# Patient Record
Sex: Female | Born: 1985 | Race: White | Hispanic: No | Marital: Single | Smoking: Current every day smoker
Health system: Southern US, Community
[De-identification: ages and names within clinical notes are randomized; demographics above are authoritative.]

---

## 2004-05-31 ENCOUNTER — Inpatient Hospital Stay: Payer: Self-pay | Admitting: Obstetrics & Gynecology

## 2005-03-11 ENCOUNTER — Emergency Department: Payer: Self-pay | Admitting: General Practice

## 2005-03-12 ENCOUNTER — Ambulatory Visit: Payer: Self-pay | Admitting: General Practice

## 2005-03-13 ENCOUNTER — Ambulatory Visit: Payer: Self-pay | Admitting: Obstetrics & Gynecology

## 2007-04-06 ENCOUNTER — Emergency Department: Payer: Self-pay | Admitting: Unknown Physician Specialty

## 2009-01-23 ENCOUNTER — Emergency Department: Payer: Self-pay | Admitting: Emergency Medicine

## 2009-09-02 ENCOUNTER — Emergency Department: Payer: Self-pay | Admitting: Emergency Medicine

## 2011-03-01 ENCOUNTER — Emergency Department: Payer: Self-pay | Admitting: Emergency Medicine

## 2012-09-07 ENCOUNTER — Emergency Department: Payer: Self-pay | Admitting: Emergency Medicine

## 2013-03-04 ENCOUNTER — Emergency Department: Payer: Self-pay

## 2013-03-04 LAB — URINALYSIS, COMPLETE
Glucose,UR: NEGATIVE mg/dL (ref 0–75)
Specific Gravity: 1.019 (ref 1.003–1.030)

## 2013-03-04 LAB — CBC
HGB: 15.6 g/dL (ref 12.0–16.0)
MCH: 31.3 pg (ref 26.0–34.0)
MCV: 91 fL (ref 80–100)
Platelet: 244 10*3/uL (ref 150–440)
RBC: 4.98 10*6/uL (ref 3.80–5.20)
RDW: 11.9 % (ref 11.5–14.5)
WBC: 12.8 10*3/uL — ABNORMAL HIGH (ref 3.6–11.0)

## 2013-03-04 LAB — WET PREP, GENITAL

## 2013-03-05 LAB — GC/CHLAMYDIA PROBE AMP

## 2013-09-01 ENCOUNTER — Ambulatory Visit: Payer: Self-pay | Admitting: Family Medicine

## 2013-09-01 LAB — COMPREHENSIVE METABOLIC PANEL
ALBUMIN: 4.2 g/dL (ref 3.4–5.0)
ALK PHOS: 56 U/L
ALT: 21 U/L (ref 12–78)
ANION GAP: 14 (ref 7–16)
AST: 13 U/L — AB (ref 15–37)
BUN: 4 mg/dL — AB (ref 7–18)
Bilirubin,Total: 0.8 mg/dL (ref 0.2–1.0)
CALCIUM: 8.8 mg/dL (ref 8.5–10.1)
CO2: 23 mmol/L (ref 21–32)
Chloride: 104 mmol/L (ref 98–107)
Creatinine: 0.87 mg/dL (ref 0.60–1.30)
EGFR (African American): >60
EGFR (Non-African Amer.): >60
Glucose: 92 mg/dL (ref 65–99)
OSMOLALITY: 278 (ref 275–301)
Potassium: 3.6 mmol/L (ref 3.5–5.1)
Sodium: 141 mmol/L (ref 136–145)
Total Protein: 7.1 g/dL (ref 6.4–8.2)

## 2013-09-01 LAB — URINALYSIS, COMPLETE
BACTERIA: NEGATIVE
BILIRUBIN, UR: NEGATIVE
GLUCOSE, UR: NEGATIVE mg/dL (ref 0–75)
Ketone: NEGATIVE
LEUKOCYTE ESTERASE: NEGATIVE
Nitrite: NEGATIVE
Ph: 6 (ref 4.5–8.0)
Protein: NEGATIVE
SPECIFIC GRAVITY: 1.005 (ref 1.003–1.030)
Squamous Epithelial: NONE SEEN

## 2013-09-01 LAB — CBC WITH DIFFERENTIAL/PLATELET
BASOS ABS: 0.1 10*3/uL (ref 0.0–0.1)
Basophil %: 0.6 %
Eosinophil #: 0.1 10*3/uL (ref 0.0–0.7)
Eosinophil %: 0.8 %
HCT: 44.8 % (ref 35.0–47.0)
HGB: 15.4 g/dL (ref 12.0–16.0)
Lymphocyte #: 2.8 10*3/uL (ref 1.0–3.6)
Lymphocyte %: 17.5 %
MCH: 32 pg (ref 26.0–34.0)
MCHC: 34.4 g/dL (ref 32.0–36.0)
MCV: 93 fL (ref 80–100)
Monocyte #: 0.8 x10 3/mm (ref 0.2–0.9)
Monocyte %: 5.1 %
NEUTROS ABS: 12 10*3/uL — AB (ref 1.4–6.5)
Neutrophil %: 76 %
PLATELETS: 246 10*3/uL (ref 150–440)
RBC: 4.81 10*6/uL (ref 3.80–5.20)
RDW: 11.7 % (ref 11.5–14.5)
WBC: 15.8 10*3/uL — ABNORMAL HIGH (ref 3.6–11.0)

## 2013-09-01 LAB — PREGNANCY, URINE: PREGNANCY TEST, URINE: NEGATIVE m[IU]/mL

## 2013-09-02 ENCOUNTER — Emergency Department: Payer: Self-pay | Admitting: Emergency Medicine

## 2013-09-02 LAB — CBC WITH DIFFERENTIAL/PLATELET
BASOS ABS: 0 10*3/uL (ref 0.0–0.1)
BASOS PCT: 0.7 %
EOS ABS: 0.2 10*3/uL (ref 0.0–0.7)
Eosinophil %: 3 %
HCT: 45.7 % (ref 35.0–47.0)
HGB: 15.5 g/dL (ref 12.0–16.0)
Lymphocyte #: 2.5 10*3/uL (ref 1.0–3.6)
Lymphocyte %: 35.2 %
MCH: 32 pg (ref 26.0–34.0)
MCHC: 33.9 g/dL (ref 32.0–36.0)
MCV: 95 fL (ref 80–100)
Monocyte #: 0.4 x10 3/mm (ref 0.2–0.9)
Monocyte %: 5.5 %
NEUTROS ABS: 4 10*3/uL (ref 1.4–6.5)
Neutrophil %: 55.6 %
Platelet: 264 10*3/uL (ref 150–440)
RBC: 4.84 10*6/uL (ref 3.80–5.20)
RDW: 12.1 % (ref 11.5–14.5)
WBC: 7.2 10*3/uL (ref 3.6–11.0)

## 2013-09-02 LAB — COMPREHENSIVE METABOLIC PANEL
ALBUMIN: 4 g/dL (ref 3.4–5.0)
ANION GAP: 3 — AB (ref 7–16)
Alkaline Phosphatase: 56 U/L
BUN: 6 mg/dL — ABNORMAL LOW (ref 7–18)
Bilirubin,Total: 0.9 mg/dL (ref 0.2–1.0)
CHLORIDE: 110 mmol/L — AB (ref 98–107)
CREATININE: 0.6 mg/dL (ref 0.60–1.30)
Calcium, Total: 8.7 mg/dL (ref 8.5–10.1)
Co2: 26 mmol/L (ref 21–32)
EGFR (African American): >60
Glucose: 92 mg/dL (ref 65–99)
Osmolality: 275 (ref 275–301)
Potassium: 4.5 mmol/L (ref 3.5–5.1)
SGOT(AST): 11 U/L — ABNORMAL LOW (ref 15–37)
SGPT (ALT): 22 U/L (ref 12–78)
SODIUM: 139 mmol/L (ref 136–145)
TOTAL PROTEIN: 7.4 g/dL (ref 6.4–8.2)

## 2013-09-02 LAB — URINALYSIS, COMPLETE
BACTERIA: NONE SEEN
BILIRUBIN, UR: NEGATIVE
Blood: NEGATIVE
GLUCOSE, UR: NEGATIVE mg/dL (ref 0–75)
Ketone: NEGATIVE
Leukocyte Esterase: NEGATIVE
Nitrite: NEGATIVE
PH: 5 (ref 4.5–8.0)
PROTEIN: NEGATIVE
RBC,UR: NONE SEEN /HPF (ref 0–5)
Specific Gravity: 1.015 (ref 1.003–1.030)
Squamous Epithelial: 1
WBC UR: NONE SEEN /HPF (ref 0–5)

## 2013-10-07 ENCOUNTER — Emergency Department: Payer: Self-pay | Admitting: Emergency Medicine

## 2013-10-07 LAB — CBC WITH DIFFERENTIAL/PLATELET
BASOS PCT: 0.6 %
Basophil #: 0 10*3/uL (ref 0.0–0.1)
EOS PCT: 1.8 %
Eosinophil #: 0.1 10*3/uL (ref 0.0–0.7)
HCT: 45.3 % (ref 35.0–47.0)
HGB: 15.5 g/dL (ref 12.0–16.0)
Lymphocyte #: 2.6 10*3/uL (ref 1.0–3.6)
Lymphocyte %: 38.7 %
MCH: 32.3 pg (ref 26.0–34.0)
MCHC: 34.3 g/dL (ref 32.0–36.0)
MCV: 94 fL (ref 80–100)
MONO ABS: 0.3 x10 3/mm (ref 0.2–0.9)
MONOS PCT: 4.7 %
Neutrophil #: 3.6 10*3/uL (ref 1.4–6.5)
Neutrophil %: 54.2 %
Platelet: 260 10*3/uL (ref 150–440)
RBC: 4.8 10*6/uL (ref 3.80–5.20)
RDW: 12.1 % (ref 11.5–14.5)
WBC: 6.7 10*3/uL (ref 3.6–11.0)

## 2013-10-07 LAB — BASIC METABOLIC PANEL
Anion Gap: 9 (ref 7–16)
BUN: 6 mg/dL — ABNORMAL LOW (ref 7–18)
CO2: 27 mmol/L (ref 21–32)
Calcium, Total: 8.5 mg/dL (ref 8.5–10.1)
Chloride: 104 mmol/L (ref 98–107)
Creatinine: 0.76 mg/dL (ref 0.60–1.30)
EGFR (African American): >60
EGFR (Non-African Amer.): >60
Glucose: 84 mg/dL (ref 65–99)
Osmolality: 276 (ref 275–301)
Potassium: 3.2 mmol/L — ABNORMAL LOW (ref 3.5–5.1)
Sodium: 140 mmol/L (ref 136–145)

## 2013-10-07 LAB — URINALYSIS, COMPLETE
BACTERIA: NONE SEEN
BILIRUBIN, UR: NEGATIVE
BLOOD: NEGATIVE
Glucose,UR: NEGATIVE mg/dL (ref 0–75)
Ketone: NEGATIVE
LEUKOCYTE ESTERASE: NEGATIVE
Nitrite: NEGATIVE
PH: 5 (ref 4.5–8.0)
PROTEIN: NEGATIVE
RBC,UR: 1 /HPF (ref 0–5)
SPECIFIC GRAVITY: 1.023 (ref 1.003–1.030)
Squamous Epithelial: 6
WBC UR: 1 /HPF (ref 0–5)

## 2013-10-07 LAB — DRUG SCREEN, URINE
Amphetamines, Ur Screen: POSITIVE (ref ?–1000)
BARBITURATES, UR SCREEN: NEGATIVE (ref ?–200)
BENZODIAZEPINE, UR SCRN: POSITIVE (ref ?–200)
CANNABINOID 50 NG, UR ~~LOC~~: POSITIVE (ref ?–50)
Cocaine Metabolite,Ur ~~LOC~~: NEGATIVE (ref ?–300)
MDMA (ECSTASY) UR SCREEN: NEGATIVE (ref ?–500)
Methadone, Ur Screen: NEGATIVE (ref ?–300)
Opiate, Ur Screen: POSITIVE (ref ?–300)
PHENCYCLIDINE (PCP) UR S: NEGATIVE (ref ?–25)
Tricyclic, Ur Screen: NEGATIVE (ref ?–1000)

## 2013-10-07 LAB — ETHANOL: Ethanol %: <0.003 % (ref 0.000–0.080)

## 2013-10-07 LAB — CK: CK, Total: 85 U/L

## 2013-10-10 ENCOUNTER — Ambulatory Visit: Payer: Self-pay | Admitting: Unknown Physician Specialty

## 2014-01-29 ENCOUNTER — Ambulatory Visit: Payer: Self-pay | Admitting: Family Medicine

## 2014-01-29 LAB — RAPID STREP-A WITH REFLX: Micro Text Report: NEGATIVE

## 2014-02-01 LAB — BETA STREP CULTURE(ARMC)

## 2014-06-23 ENCOUNTER — Observation Stay: Payer: Self-pay | Admitting: Internal Medicine
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
## 2015-05-13 ENCOUNTER — Encounter: Payer: Self-pay | Admitting: *Deleted

## 2015-05-13 ENCOUNTER — Ambulatory Visit
Admission: EM | Admit: 2015-05-13 | Discharge: 2015-05-13 | Disposition: A | Discharge status: Home / Self Care | Payer: Medicaid Other | Attending: Family Medicine | Admitting: Family Medicine

## 2015-05-13 DIAGNOSIS — R197 Diarrhea, unspecified: Secondary | ICD-10-CM | POA: Diagnosis not present

## 2015-05-13 DIAGNOSIS — R112 Nausea with vomiting, unspecified: Secondary | ICD-10-CM

## 2015-05-13 DIAGNOSIS — F17200 Nicotine dependence, unspecified, uncomplicated: Secondary | ICD-10-CM | POA: Insufficient documentation

## 2015-05-13 LAB — CBC WITH DIFFERENTIAL/PLATELET
Basophils Absolute: 0 10*3/uL (ref 0–0.1)
Basophils Relative: 1 %
Eosinophils Absolute: 0.1 10*3/uL (ref 0–0.7)
Eosinophils Relative: 3 %
HCT: 45.4 % (ref 35.0–47.0)
Hemoglobin: 15.5 g/dL (ref 12.0–16.0)
Lymphocytes Relative: 32 %
Lymphs Abs: 1.4 10*3/uL (ref 1.0–3.6)
MCH: 32.2 pg (ref 26.0–34.0)
MCHC: 34.2 g/dL (ref 32.0–36.0)
MCV: 94 fL (ref 80.0–100.0)
Monocytes Absolute: 0.4 10*3/uL (ref 0.2–0.9)
Monocytes Relative: 9 %
Neutro Abs: 2.4 10*3/uL (ref 1.4–6.5)
Neutrophils Relative %: 55 %
Platelets: 217 10*3/uL (ref 150–440)
RBC: 4.83 MIL/uL (ref 3.80–5.20)
RDW: 11.6 % (ref 11.5–14.5)
WBC: 4.3 10*3/uL (ref 3.6–11.0)

## 2015-05-13 LAB — URINALYSIS COMPLETE WITH MICROSCOPIC (ARMC ONLY)
Glucose, UA: NEGATIVE mg/dL
Hgb urine dipstick: NEGATIVE
Ketones, ur: NEGATIVE mg/dL
Leukocytes, UA: NEGATIVE
Nitrite: NEGATIVE
Protein, ur: NEGATIVE mg/dL
Specific Gravity, Urine: 1.025 (ref 1.005–1.030)
pH: 5.5 (ref 5.0–8.0)

## 2015-05-13 LAB — COMPREHENSIVE METABOLIC PANEL
ALT: 13 U/L — ABNORMAL LOW (ref 14–54)
AST: 14 U/L — ABNORMAL LOW (ref 15–41)
Albumin: 4.4 g/dL (ref 3.5–5.0)
Alkaline Phosphatase: 64 U/L (ref 38–126)
Anion gap: 9 (ref 5–15)
BUN: 8 mg/dL (ref 6–20)
CO2: 22 mmol/L (ref 22–32)
Calcium: 8.9 mg/dL (ref 8.9–10.3)
Chloride: 107 mmol/L (ref 101–111)
Creatinine, Ser: 0.78 mg/dL (ref 0.44–1.00)
GFR calc Af Amer: >60 mL/min (ref >60)
GFR calc non Af Amer: >60 mL/min (ref >60)
Glucose, Bld: 93 mg/dL (ref 65–99)
Potassium: 4 mmol/L (ref 3.5–5.1)
Sodium: 138 mmol/L (ref 135–145)
Total Bilirubin: 0.8 mg/dL (ref 0.3–1.2)
Total Protein: 7.4 g/dL (ref 6.5–8.1)

## 2015-05-13 LAB — RAPID STREP SCREEN (MED CTR MEBANE ONLY): Streptococcus, Group A Screen (Direct): NEGATIVE

## 2015-05-13 MED ORDER — ONDANSETRON 4 MG PO TBDP: 4.0000 mg | ORAL_TABLET | Freq: Three times a day (TID) | ORAL | Status: DC | PRN

## 2015-05-13 NOTE — ED Provider Notes (Signed)
CSN: [id number]     Arrival date & time 05/13/15  0831 History   First MD Initiated Contact with Patient 05/13/15 0914     Chief Complaint  Patient presents with  . Nausea  . Emesis   (Consider location/radiation/quality/duration/timing/severity/associated sxs/prior Treatment) HPI  This a 30 year old female who reports having nausea and vomiting that occurred yesterday about 10:30 morning. She states that her last vomiting episode was 6:00 last night and she has not vomited since. However the diarrhea persists. His had a bowel movement approximately every 2 hours. There is no blood or mucus in the in the stool is very watery without any form. There is no accompanying pain in the abdomen. [place] any urinary tract symptoms. She does have a sore throat and generalized body aches. The patient does not appear to be toxic. She states that she is able to drink fluids freely and actually had some new chicken noodle soup last night for dinner. She did take her temperature yesterday which registered 101. He has an implanted with control device.  History reviewed. No pertinent past medical history. History reviewed. No pertinent past surgical history. History reviewed. No pertinent family history. Social History  Substance Use Topics  . Smoking status: Current Every Day Smoker  . Smokeless tobacco: Never Used  . Alcohol Use: No   OB History    No data available     Review of Systems  Constitutional: Positive for fever, appetite change and fatigue. Negative for activity change.  HENT: Positive for sore throat.   Gastrointestinal: Positive for nausea, vomiting, abdominal pain and diarrhea. Negative for blood in stool and abdominal distention.  All other systems reviewed and are negative.   Allergies  Bactrim  Home Medications   Prior to Admission medications   Medication Sig Start Date End Date Taking? Authorizing Provider  ondansetron (ZOFRAN ODT) 4 MG disintegrating tablet Take 1 tablet (4  mg total) by mouth every 8 (eight) hours as needed for nausea or vomiting. 05/13/15   Lutricia Feil, PA-C   Meds Ordered and Administered this Visit  Medications - No data to display  BP 123/77 mmHg  Pulse 58  Temp(Src) 97.8 F (36.6 C) (Oral)  Resp 18  Ht  (1.499 m)  Wt 138 lb (62.596 kg)  BMI 27.86 kg/m2  SpO2 100%  LMP 04/14/2015 No data found.   Physical Exam  Constitutional: She is oriented to person, place, and time. She appears well-developed and well-nourished. No distress.  HENT:  Head: Normocephalic and atraumatic.  Right Ear: External ear normal.  Left Ear: External ear normal.  Nose: Nose normal.  Mouth/Throat: Oropharynx is clear and moist. No oropharyngeal exudate.  Examination oropharynx shows the tonsils. Large with a cobblestone appearance but no exudate is seen.  Eyes: Conjunctivae are normal. Pupils are equal, round, and reactive to light.  Neck: Normal range of motion. Neck supple.  Pulmonary/Chest: Effort normal and breath sounds normal. No respiratory distress. She has no wheezes. She has no rales.  Abdominal: Soft. Bowel sounds are normal. She exhibits no distension. There is tenderness. There is no rebound and no guarding.  Examination of the abdomen was performed in the presence of Heather, RN as chaperone. There is no distention present. Striae present on the abdominal wall. The sounds are present in all quadrants and active. There is no guarding present. Rebound is present. He has had some mild tenderness in the left lower quadrant but the remainder of the abdominal exam is negative.  Musculoskeletal: Normal range of motion. She exhibits no edema or tenderness.  Lymphadenopathy:    She has no cervical adenopathy.  Neurological: She is alert and oriented to person, place, and time.  Skin: Skin is warm and dry. She is not diaphoretic.  Psychiatric: She has a normal mood and affect. Her behavior is normal. Judgment and thought content normal.   Nursing note and vitals reviewed.   ED Course  Procedures (including critical care time)  Labs Review Labs Reviewed  COMPREHENSIVE METABOLIC PANEL - Abnormal; Notable for the following:    AST 14 (*)    ALT 13 (*)    All other components within normal limits  URINALYSIS COMPLETEWITH MICROSCOPIC ([facility] ONLY) - Abnormal; Notable for the following:    Color, Urine AMBER (*)    APPearance CLOUDY (*)    Bilirubin Urine 1+ (*)    Bacteria, UA FEW (*)    Squamous Epithelial / LPF 6-30 (*)    All other components within normal limits  RAPID STREP SCREEN (NOT AT [facility])  CULTURE, GROUP A STREP ([facility])  CBC WITH DIFFERENTIAL/PLATELET    Imaging Review No results found.   Visual Acuity Review  Right Eye Distance:   Left Eye Distance:   Bilateral Distance:    Right Eye Near:   Left Eye Near:    Bilateral Near:         MDM   1. Nausea, vomiting, and diarrhea    Discharge Medication List as of 05/13/2015 10:33 AM    START taking these medications   Details  ondansetron (ZOFRAN ODT) 4 MG disintegrating tablet Take 1 tablet (4 mg total) by mouth every 8 (eight) hours as needed for nausea or vomiting., Starting 05/13/2015, Until Discontinued, Normal      Plan: 1. Test/x-ray results and diagnosis reviewed with patient 2. rx as per orders; risks, benefits, potential side effects reviewed with patient 3. Recommend supportive treatment with increase fluids and rest. Given her diet for diarrhea. She'll follow-up with her primary care she not improving or worsens. Given her off today and tomorrow to allow her diarrhea to subside he may return to work the next day. 4 F/u prn if symptoms worsen or don't improve     Lutricia Feil, PA-C 05/13/15 1038

## 2015-05-13 NOTE — Discharge Instructions (Signed)
Food Choices to Help Relieve Diarrhea, Adult °When you have diarrhea, the foods you eat and your eating habits are very important. Choosing the right foods and drinks can help relieve diarrhea. Also, because diarrhea can last up to 7 days, you need to replace lost fluids and electrolytes (such as sodium, potassium, and chloride) in order to help prevent dehydration.  °WHAT GENERAL GUIDELINES DO I NEED TO FOLLOW? °· Slowly drink 1 cup (8 oz) of fluid for each episode of diarrhea. If you are getting enough fluid, your urine will be clear or pale yellow. °· Eat starchy foods. Some good choices include white rice, white toast, pasta, low-fiber cereal, baked potatoes (without the skin), saltine crackers, and bagels. °· Avoid large servings of any cooked vegetables. °· Limit fruit to two servings per day. A serving is ½ cup or 1 small piece. °· Choose foods with less than 2 g of fiber per serving. °· Limit fats to less than 8 tsp (38 g) per day. °· Avoid fried foods. °· Eat foods that have probiotics in them. Probiotics can be found in certain dairy products. °· Avoid foods and beverages that may increase the speed at which food moves through the stomach and intestines (gastrointestinal tract). Things to avoid include: °¨ High-fiber foods, such as dried fruit, raw fruits and vegetables, nuts, seeds, and whole grain foods. °¨ Spicy foods and high-fat foods. °¨ Foods and beverages sweetened with high-fructose corn syrup, honey, or sugar alcohols such as xylitol, sorbitol, and mannitol. °WHAT FOODS ARE RECOMMENDED? °Grains °White rice. White, French, or pita breads (fresh or toasted), including plain rolls, buns, or bagels. White pasta. Saltine, soda, or graham crackers. Pretzels. Low-fiber cereal. Cooked cereals made with water (such as cornmeal, farina, or cream cereals). Plain muffins. Matzo. Melba toast. Zwieback.  °Vegetables °Potatoes (without the skin). Strained tomato and vegetable juices. Most well-cooked and canned  vegetables without seeds. Tender lettuce. °Fruits °Cooked or canned applesauce, apricots, cherries, fruit cocktail, grapefruit, peaches, pears, or plums. Fresh bananas, apples without skin, cherries, grapes, cantaloupe, grapefruit, peaches, oranges, or plums.  °Meat and Other Protein Products °Baked or boiled chicken. Eggs. Tofu. Fish. Seafood. Smooth peanut butter. Ground or well-cooked tender beef, ham, veal, lamb, pork, or poultry.  °Dairy °Plain yogurt, kefir, and unsweetened liquid yogurt. Lactose-free milk, buttermilk, or soy milk. Plain hard cheese. °Beverages °Sport drinks. Clear broths. Diluted fruit juices (except prune). Regular, caffeine-free sodas such as ginger ale. Water. Decaffeinated teas. Oral rehydration solutions. Sugar-free beverages not sweetened with sugar alcohols. °Other °Bouillon, broth, or soups made from recommended foods.  °The items listed above may not be a complete list of recommended foods or beverages. Contact your dietitian for more options. °WHAT FOODS ARE NOT RECOMMENDED? °Grains °Whole grain, whole wheat, bran, or rye breads, rolls, pastas, crackers, and cereals. Wild or brown rice. Cereals that contain more than 2 g of fiber per serving. Corn tortillas or taco shells. Cooked or dry oatmeal. Granola. Popcorn. °Vegetables °Raw vegetables. Cabbage, broccoli, Brussels sprouts, artichokes, baked beans, beet greens, corn, kale, legumes, peas, sweet potatoes, and yams. Potato skins. Cooked spinach and cabbage. °Fruits °Dried fruit, including raisins and dates. Raw fruits. Stewed or dried prunes. Fresh apples with skin, apricots, mangoes, pears, raspberries, and strawberries.  °Meat and Other Protein Products °Chunky peanut butter. Nuts and seeds. Beans and lentils. Bacon.  °Dairy °High-fat cheeses. Milk, chocolate milk, and beverages made with milk, such as milk shakes. Cream. Ice cream. °Sweets and Desserts °Sweet rolls, doughnuts, and sweet breads.   Pancakes and waffles. Fats and  Oils Butter. Cream sauces. Margarine. Salad oils. Plain salad dressings. Olives. Avocados.  Beverages Caffeinated beverages (such as coffee, tea, soda, or energy drinks). Alcoholic beverages. Fruit juices with pulp. Prune juice. Soft drinks sweetened with high-fructose corn syrup or sugar alcohols. Other Coconut. Hot sauce. Chili powder. Mayonnaise. Gravy. Cream-based or milk-based soups.  The items listed above may not be a complete list of foods and beverages to avoid. Contact your dietitian for more information. WHAT SHOULD I DO IF I BECOME DEHYDRATED? Diarrhea can sometimes lead to dehydration. Signs of dehydration include dark urine and dry mouth and skin. If you think you are dehydrated, you should rehydrate with an oral rehydration solution. These solutions can be purchased at pharmacies, retail stores, or online.  Drink -1 cup (120-240 mL) of oral rehydration solution each time you have an episode of diarrhea. If drinking this amount makes your diarrhea worse, try drinking smaller amounts more often. For example, drink 1-3 tsp (5-15 mL) every 5-10 minutes.  A general rule for staying hydrated is to drink 1-2 L of fluid per day. Talk to your health care provider about the specific amount you should be drinking each day. Drink enough fluids to keep your urine clear or pale yellow.   This information is not intended to replace advice given to you by your health care provider. Make sure you discuss any questions you have with your health care provider.   Document Released: 06/30/2003 Document Revised: 04/30/2014 Document Reviewed: 03/02/2013 Elsevier Interactive Patient Education 2016 Else[employer]  Diarrhea Diarrhea is watery poop (stool). It can make you feel weak, tired, thirsty, or give you a dry mouth (signs of dehydration). Watery poop is a sign of another problem, most often an infection. It often lasts 2-3 days. It can last longer if it is a sign of something serious. Take care of  yourself as told by your doctor. HOME CARE   Drink 1 cup (8 ounces) of fluid each time you have watery poop.  Do not drink the following fluids:  Those that contain simple sugars (fructose, glucose, galactose, lactose, sucrose, maltose).  Sports drinks.  Fruit juices.  Whole milk products.  Sodas.  Drinks with caffeine (coffee, tea, soda) or alcohol.  Oral rehydration solution may be used if the doctor says it is okay. You may make your own solution. Follow this recipe:   - teaspoon table salt.   teaspoon baking soda.   teaspoon salt substitute containing potassium chloride.  1 tablespoons sugar.  1 liter (34 ounces) of water.  Avoid the following foods:  High fiber foods, such as raw fruits and vegetables.  Nuts, seeds, and whole grain breads and cereals.   Those that are sweetened with sugar alcohols (xylitol, sorbitol, mannitol).  Try eating the following foods:  Starchy foods, such as rice, toast, pasta, low-sugar cereal, oatmeal, baked potatoes, crackers, and bagels.  Bananas.  Applesauce.  Eat probiotic-rich foods, such as yogurt and milk products that are fermented.  Wash your hands well after each time you have watery poop.  Only take medicine as told by your doctor.  Take a warm bath to help lessen burning or pain from having watery poop. GET HELP RIGHT AWAY IF:   You cannot drink fluids without throwing up (vomiting).  You keep throwing up.  You have blood in your poop, or your poop looks black and tarry.  You do not pee (urinate) in 6-8 hours, or there is only a small  of very dark pee.  You have belly (abdominal) pain that gets worse or stays in the same spot (localizes).  You are weak, dizzy, confused, or light-headed.  You have a very bad headache.  Your watery poop gets worse or does not get better.  You have a fever or lasting symptoms for more than 2-3 days.  You have a fever and your symptoms suddenly get worse. MAKE  SURE YOU:   Understand these instructions.  Will watch your condition.  Will get help right away if you are not doing well or get worse.   This information is not intended to replace advice given to you by your health care provider. Make sure you discuss any questions you have with your health care provider.   Document Released: 09/26/2007 Document Revised: 04/30/2014 Document Reviewed: 12/16/2011 Elsevier Interactive Patient Education 2016 Elsevier Inc.  

## 2015-05-13 NOTE — ED Notes (Signed)
Patient reports nausea and vomiting that started yesterday. Additional symptoms of diarrhea, sore throat, and generalized body aches. Last vomiting episode occurred yesterday at 1800. Diarrhea is still on going.

## 2015-05-15 LAB — CULTURE, GROUP A STREP (THRC)

## 2015-05-16 ENCOUNTER — Telehealth: Payer: Self-pay | Admitting: *Deleted

## 2015-05-16 NOTE — ED Notes (Signed)
Called patient and informed her that her strep culture came back negative. Patient confirmed understanding of information. 

## 2019-01-14 ENCOUNTER — Other Ambulatory Visit: Payer: Self-pay

## 2019-01-14 ENCOUNTER — Encounter: Payer: Self-pay | Admitting: Emergency Medicine

## 2019-01-14 ENCOUNTER — Ambulatory Visit
Admission: EM | Admit: 2019-01-14 | Discharge: 2019-01-14 | Disposition: A | Discharge status: Home / Self Care | Payer: BC Managed Care – PPO | Attending: Internal Medicine | Admitting: Internal Medicine

## 2019-01-14 ENCOUNTER — Ambulatory Visit (INDEPENDENT_AMBULATORY_CARE_PROVIDER_SITE_OTHER): Payer: BC Managed Care – PPO

## 2019-01-14 DIAGNOSIS — Y9368 Activity, volleyball (beach) (court): Secondary | ICD-10-CM

## 2019-01-14 DIAGNOSIS — M25571 Pain in right ankle and joints of right foot: Secondary | ICD-10-CM | POA: Diagnosis not present

## 2019-01-14 MED ORDER — MELOXICAM 15 MG PO TABS: 15.0000 mg | ORAL_TABLET | Freq: Every day | ORAL | 0 refills | Status: DC | PRN

## 2019-01-14 NOTE — Discharge Instructions (Signed)
Take medication as prescribed. Rest. Ice. Elevate.   Follow up with orthopedic as needed for continued pain.   Follow up with your primary care physician this week as needed. Return to Urgent care for new or worsening concerns.

## 2019-01-14 NOTE — ED Triage Notes (Signed)
Patient states she was playing volleyball yesterday and stepped in a hole twisting her right ankle. She is c/o right ankle pain and heel pain.

## 2019-01-14 NOTE — ED Provider Notes (Signed)
[facility] ____________________________________________  Time seen: Approximately 3:34 PM  I have reviewed the triage vital signs and the nursing notes.   HISTORY  Chief Complaint Ankle Pain  HPI Brandy Snyder is a 33 y.o. female presenting for evaluation of right ankle pain post injury that occurred yesterday.  Patient reports that she was playing outdoor volleyball, stepped in a hole and rolled her right ankle.  Felt a pop with associated pain.  Has tolerated weightbearing but painful.  Denies paresthesias, pain radiation or loss of range of motion.  Was able to continue to work today.  Did take ibuprofen earlier with minimal improvement.  Denies other injuries.  Reports otherwise doing well.  No recent cough, fevers or sickness.  No LMP recorded. Patient has had an implant.  Denies pregnancy.   History reviewed. No pertinent past medical history.  There are no active problems to display for this patient.   History reviewed. No pertinent surgical history.   No current facility-administered medications for this encounter.   Current Outpatient Medications:  .  meloxicam (MOBIC) 15 MG tablet, Take 1 tablet (15 mg total) by mouth daily as needed., Disp: 10 tablet, Rfl: 0  Allergies Bactrim [sulfamethoxazole-trimethoprim]  History reviewed. No pertinent family history.  Social History Social History   Tobacco Use  . Smoking status: Current Every Day Smoker  . Smokeless tobacco: Never Used  Substance Use Topics  . Alcohol use: No  . Drug use: No    Review of Systems Constitutional: No fever ENT: No sore throat. Cardiovascular: Denies chest pain. Respiratory: Denies shortness of breath. Musculoskeletal: Positive right ankle pain. Skin: Negative for rash. Neurological: Negative forfocal weakness or numbness.    ____________________________________________   PHYSICAL EXAM:  VITAL SIGNS: ED Triage Vitals [01/14/19 1436]  Enc Vitals Group   BP (!) 141/96     Pulse Rate 87     Resp 18     Temp 98.4 F (36.9 C)     Temp Source Oral     SpO2 100 %     Weight 140 lb (63.5 kg)     Height 4\' 11"  (1.499 m)     Head Circumference      Peak Flow      Pain Score 7     Pain Loc      Pain Edu?      Excl. in GC?     Constitutional: Alert and oriented. Well appearing and in no acute distress. ENT      Head: Normocephalic and atraumatic. Cardiovascular: Normal rate, regular rhythm. Grossly normal heart sounds.  Good peripheral circulation. Respiratory: Normal respiratory effort without tachypnea nor retractions. Breath sounds are clear and equal bilaterally. No wheezes, rales, rhonchi. Musculoskeletal: Ambulatory with antalgic gait.  Bilateral pedal pulses equal and easily palpated.   Except: Right lateral ankle mild to moderate localized edema with moderate direct tenderness to direct palpation, minimal medial malleolar tenderness, tenderness along ATFL ligament, right lower extremity otherwise nontender, mild pain with plantar flexion dorsiflexion as well as ankle rotation but full range of motion present, no Achilles tendon tenderness. Neurologic:  Normal speech and language.  Skin:  Skin is warm, dry and intact. No rash noted. Psychiatric: Mood and affect are normal. Speech and behavior are normal. Patient exhibits appropriate insight and judgment   ___________________________________________   LABS (all labs ordered are listed, but only abnormal results are displayed)  Labs Reviewed - No data to display ____________________________________________  RADIOLOGY  Dg Ankle Complete Right  Result Date: 01/14/2019 CLINICAL DATA:  Fall last night into a hole. Right ankle pain and pain extends upward past joint. EXAM: RIGHT ANKLE - COMPLETE 3+ VIEW COMPARISON:  None. FINDINGS: There is no evidence of fracture, dislocation, or joint effusion. Small calcaneal spur. There is no evidence of arthropathy or other focal bone abnormality.  Ankle mortise intact. Lateral soft tissue swelling. IMPRESSION: Lateral soft tissue swelling without fracture suggesting ligamentous injury. Electronically Signed   By: Lucrezia Europe M.D.   On: 01/14/2019 15:12   ____________________________________________   PROCEDURES Procedures    INITIAL IMPRESSION / ASSESSMENT AND PLAN / ED COURSE  Pertinent labs & imaging results that were available during my care of the patient were reviewed by me and considered in my medical decision making (see chart for details).  Patient.  Right ankle pain post mechanical injury.  X-ray as above, reviewed, no acute osseous abnormality.  Suspect sprain injury versus further ligamentous injury.  Encourage rest, ice, supportive care.  Cam walking boot given.  Rx Mobic.  Work note given for tomorrow.  Follow-up with orthopedic as needed for continued pain.Discussed indication, risks and benefits of medications with patient.  Discussed follow up with Primary care physician this week. Discussed follow up and return parameters including no resolution or any worsening concerns. Patient verbalized understanding and agreed to plan.   ____________________________________________   FINAL CLINICAL IMPRESSION(S) / ED DIAGNOSES  Final diagnoses:  Acute right ankle pain     ED Discharge Orders         Ordered    meloxicam (MOBIC) 15 MG tablet  Daily PRN     01/14/19 1529           Note: This dictation was prepared with Dragon dictation along with smaller phrase technology. Any transcriptional errors that result from this process are unintentional.         Marylene Land, NP 01/14/19 1702

## 2019-08-04 ENCOUNTER — Telehealth: Payer: Self-pay | Admitting: Advanced Practice Midwife

## 2019-08-04 NOTE — Telephone Encounter (Signed)
Noted  

## 2019-08-04 NOTE — Telephone Encounter (Signed)
4/22 at 230 for nexplanon replacement with [facility] in [facility]

## 2019-08-13 ENCOUNTER — Other Ambulatory Visit: Payer: Self-pay

## 2019-08-13 ENCOUNTER — Ambulatory Visit (INDEPENDENT_AMBULATORY_CARE_PROVIDER_SITE_OTHER): Payer: BC Managed Care – PPO | Admitting: Advanced Practice Midwife

## 2019-08-13 ENCOUNTER — Encounter: Payer: Self-pay | Admitting: Advanced Practice Midwife

## 2019-08-13 VITALS — BP 124/74 | HR 66 | Ht 59.0 in | Wt 173.0 lb

## 2019-08-13 DIAGNOSIS — Z3046 Encounter for surveillance of implantable subdermal contraceptive: Secondary | ICD-10-CM

## 2019-08-13 NOTE — Progress Notes (Signed)
GYNECOLOGY PROCEDURE NOTE  Nexplanon removal discussed in detail.  Risks of infection, bleeding, nerve injury all reviewed.  Patient understands risks and desires to proceed.  Verbal consent obtained.  Patient is certain she wants the Nexplanon removed.  All questions answered. Her current Nexplanon was placed 3 years ago and she is due for removal and reinsertion.   Review of Systems  Constitutional: Negative.   HENT: Negative.   Eyes: Negative.   Respiratory: Negative.   Cardiovascular: Negative.   Gastrointestinal: Negative.   Genitourinary: Negative.   Musculoskeletal: Negative.   Skin: Negative.   Neurological: Negative.   Endo/Heme/Allergies: Negative.   Psychiatric/Behavioral: Negative.    Vital Signs: BP 124/74   Pulse 66   Ht 4\' 11"  (1.499 m)   Wt 173 lb (78.5 kg)   BMI 34.94 kg/m  Constitutional: Well nourished, well developed female in no acute distress.  HEENT: normal Skin: Warm and dry.  Extremity: Nexplanon easily palpated in left arm Respiratory:  Normal respiratory effort Neuro: DTRs 2+, Cranial nerves grossly intact Psych: Alert and Oriented x3. No memory deficits. Normal mood and affect.  MS: normal gait, normal bilateral lower extremity ROM/strength/stability.   Procedure: Patient placed in dorsal supine with left arm above head, elbow flexed at 90 degrees, arm resting on examination table.  Nexplanon identified without problems.  Betadine scrub x2.  2 ml of 1% lidocaine injected under Nexplanon device without problems.  Sterile gloves applied.  Small 0.5cm incision made at distal tip of Nexplanon device with 11 blade scalpel. Nexplanon brought to incision and grasped with a small kelly clamp.  Nexplanon removed intact without problems.  Pressure applied to incision.  Hemostasis obtained.  Patient tolerated procedure well.  No complications.   Assessment: 34 y.o. year old female now s/p uncomplicated Nexplanon removal.  Plan: 1.  Patient given post  procedure precautions and asked to call for fever, chills, redness or drainage from her incision, bleeding from incision.  She understands she will likely have a small bruise near site of removal and can remove bandage tomorrow and steri-strips in approximately 1 week.  2) Contraception: See following note for Nexplanon reinsertion  Suzie Vandam, [address]   [place] for lidocaine block, [phone] for nexplanon removal     GYNECOLOGY PROCEDURE NOTE  Patient is a 34 y.o. No obstetric history on file. presenting for Nexplanon insertion as her desired means of contraception.  She provided informed consent, signed copy in the chart, time out was performed.  No LMP recorded. Patient has had an implant.  She understands that Nexplanon is a progesterone only therapy, and that patients often have irregular and unpredictable vaginal bleeding or amenorrhea. She understands that other side effects are possible related to systemic progesterone, including but not limited to, headaches, breast tenderness, nausea, and irritability. While effective at preventing pregnancy long acting reversible contraceptives do not prevent transmission of sexually transmitted diseases and use of barrier methods for this purpose was discussed. The placement procedure for Nexplanon was reviewed with the patient in detail including risks of nerve injury, infection, bleeding and injury to other muscles or tendons. She understands that the Nexplanon implant is good for 3 years and needs to be removed at the end of that time.  She understands that Nexplanon is an extremely effective option for contraception, with failure rate of <1%. This information is reviewed today and all questions were answered. Informed consent was obtained, both verbally and written.   The patient is healthy and has no contraindications  to Nexplanon use.  Procedure Appropriate time out taken.  Patient continued in dorsal supine with left arm above head, elbow flexed at  90 degrees, arm resting on examination table.   Nexplanon removed form sterile blister packaging,  Device confirmed in needle, before inserting full length of needle, tenting up the skin as the needle was advanced at the site of removal of previous Nexplanon.  The drug eluding rod was then deployed by pulling back the slider per the manufactures recommendation.  The implant was palpable by the clinician as well as the patient.  The insertion site covered dressed with a band aid before applying  a kerlex bandage pressure dressing..Minimal blood loss was noted during the procedure.  The patient tolerated the procedure well.   She was instructed to wear the bandage for 24 hours, call with any signs of infection.  She was given the Nexplanon card and instructed to have the rod removed in 3 years.   Christean Leaf, CNM [facility] 08/13/19, 3:05 PM    Charge [phone] for nexplanon device, CPT [phone] for procedure J2001 for lidocaine administration Modifer 25, plus Modifer 79 is done during a global billing visit

## 2020-07-11 ENCOUNTER — Other Ambulatory Visit: Payer: Self-pay

## 2020-07-11 ENCOUNTER — Ambulatory Visit
Admission: EM | Admit: 2020-07-11 | Discharge: 2020-07-11 | Disposition: A | Discharge status: Home / Self Care | Payer: BC Managed Care – PPO | Attending: Family Medicine | Admitting: Family Medicine

## 2020-07-11 DIAGNOSIS — J111 Influenza due to unidentified influenza virus with other respiratory manifestations: Secondary | ICD-10-CM | POA: Diagnosis not present

## 2020-07-11 LAB — RAPID INFLUENZA A&B ANTIGENS
Influenza A (ARMC): NEGATIVE
Influenza B (ARMC): NEGATIVE

## 2020-07-11 MED ORDER — BENZONATATE 200 MG PO CAPS: 200.0000 mg | ORAL_CAPSULE | Freq: Three times a day (TID) | ORAL | 0 refills | Status: DC | PRN

## 2020-07-11 MED ORDER — XOFLUZA (40 MG DOSE) 2 X 20 MG PO TBPK: 40.0000 mg | ORAL_TABLET | Freq: Once | ORAL | 0 refills | Status: AC

## 2020-07-11 MED ORDER — KETOROLAC TROMETHAMINE 10 MG PO TABS: 10.0000 mg | ORAL_TABLET | Freq: Four times a day (QID) | ORAL | 0 refills | Status: DC | PRN

## 2020-07-11 NOTE — ED Provider Notes (Signed)
[facility]    CSN: [id number] Arrival date & time: 07/11/20  1315      History   Chief Complaint Chief Complaint  Patient presents with  . Nasal Congestion  . Generalized Body Aches   HPI  35 year old female presents with the above complaints.  Symptoms started on Saturday.  She reports body aches, headache, sore throat, congestion, diarrhea, body aches.  Subjective fever.  No relieving factors.  Pain 7/10 in severity.  No reported sick contacts. No other complaints.   Home Medications    Prior to Admission medications   Medication Sig Start Date End Date Taking? Authorizing Provider  Baloxavir Marboxil,40 MG Dose, (XOFLUZA, 40 MG DOSE,) 2 x 20 MG TBPK Take 40 mg by mouth once for 1 dose. 07/11/20 07/11/20 Yes Chennel Olivos G, DO  benzonatate (TESSALON) 200 MG capsule Take 1 capsule (200 mg total) by mouth 3 (three) times daily as needed for cough. 07/11/20  Yes Tavish Gettis G, DO  etonogestrel (NEXPLANON) 68 MG IMPL implant 1 each by Subdermal route once.   Yes [provider]  ketorolac (TORADOL) 10 MG tablet Take 1 tablet (10 mg total) by mouth every 6 (six) hours as needed for moderate pain or severe pain. 07/11/20  Yes Tommie Sams, DO   Social History Social History   Tobacco Use  . Smoking status: Current Every Day Smoker    Types: Cigarettes  . Smokeless tobacco: Never Used  Vaping Use  . Vaping Use: Never used  Substance Use Topics  . Alcohol use: No  . Drug use: No     Allergies   Bactrim [sulfamethoxazole-trimethoprim]   Review of Systems Review of Systems Per HPI  Physical Exam Triage Vital Signs ED Triage Vitals  Enc Vitals Group     BP 07/11/20 1406 122/86     Pulse Rate 07/11/20 1406 72     Resp 07/11/20 1406 18     Temp 07/11/20 1406 98.2 F (36.8 C)     Temp Source 07/11/20 1406 Oral     SpO2 07/11/20 1406 100 %     Weight 07/11/20 1404 160 lb (72.6 kg)     Height 07/11/20 1404 4' 11.5" (1.511 m)     Head  Circumference --      Peak Flow --      Pain Score 07/11/20 1404 7     Pain Loc --      Pain Edu? --      Excl. in [place]? --    Updated Vital Signs BP 122/86 (BP Location: Left Arm)   Pulse 72   Temp 98.2 F (36.8 C) (Oral)   Resp 18   Ht 4' 11.5" (1.511 m)   Wt 72.6 kg   SpO2 100%   BMI 31.78 kg/m   Visual Acuity Right Eye Distance:   Left Eye Distance:   Bilateral Distance:    Right Eye Near:   Left Eye Near:    Bilateral Near:     Physical Exam Vitals and nursing note reviewed.  Constitutional:      General: She is not in acute distress.    Appearance: Normal appearance. She is not ill-appearing.  HENT:     Head: Normocephalic and atraumatic.     Right Ear: Tympanic membrane normal.     Left Ear: Tympanic membrane normal.     Mouth/Throat:     Pharynx: Posterior oropharyngeal erythema present. No oropharyngeal exudate.  Cardiovascular:  Rate and Rhythm: Normal rate and regular rhythm.  Pulmonary:     Effort: Pulmonary effort is normal.     Breath sounds: Normal breath sounds. No wheezing, rhonchi or rales.  Neurological:     Mental Status: She is alert.  Psychiatric:        Mood and Affect: Mood normal.        Behavior: Behavior normal.    UC Treatments / Results  Labs (all labs ordered are listed, but only abnormal results are displayed) Labs Reviewed  RAPID INFLUENZA A&B ANTIGENS    EKG   Radiology No results found.  Procedures Procedures (including critical care time)  Medications Ordered in UC Medications - No data to display  Initial Impression / Assessment and Plan / UC Course  I have reviewed the triage vital signs and the nursing notes.  Pertinent labs & imaging results that were available during my care of the patient were reviewed by me and considered in my medical decision making (see chart for details).    35 year old female presents with an influenza-like illness.  Rapid flu negative today.  Given her symptomatology, I am  still concerned that she may have influenza.  Placing on Xofluza.  Toradol for body aches.  Tessalon Perles for cough.  Final Clinical Impressions(s) / UC Diagnoses   Final diagnoses:  Influenza-like illness     Discharge Instructions     Rest.  Fluids.  Medication as prescribed.  Take care  Dr. Adriana Simas    ED Prescriptions    Medication Sig Dispense Auth. Provider   Baloxavir Marboxil,40 MG Dose, (XOFLUZA, 40 MG DOSE,) 2 x 20 MG TBPK Take 40 mg by mouth once for 1 dose. 2 each Tommie Sams, DO   benzonatate (TESSALON) 200 MG capsule Take 1 capsule (200 mg total) by mouth 3 (three) times daily as needed for cough. 30 capsule Maddox Hlavaty G, DO   ketorolac (TORADOL) 10 MG tablet Take 1 tablet (10 mg total) by mouth every 6 (six) hours as needed for moderate pain or severe pain. 20 tablet Tommie Sams, DO     PDMP not reviewed this encounter.   Tommie Sams, [place] 07/11/20 1951

## 2020-07-11 NOTE — Discharge Instructions (Signed)
Rest. Fluids.  Medication as prescribed.   Take care  Dr. Mishal Probert  

## 2020-07-11 NOTE — ED Triage Notes (Addendum)
Pt c/o body aches, headache, sore throat, nasal congestion since Saturday. Pt states she feels like she has the flu. Pt took an at-home COVID test and it was negative.

## 2021-01-03 IMAGING — CR DG ANKLE COMPLETE 3+V*R*
3 series · 3 of 3 positions shown · non-contrast
Comparison: None.

CLINICAL DATA: Fall last night into a hole. Right ankle pain and
pain extends upward past joint.

EXAM:
RIGHT ANKLE - COMPLETE 3+ VIEW

[ankle ap]
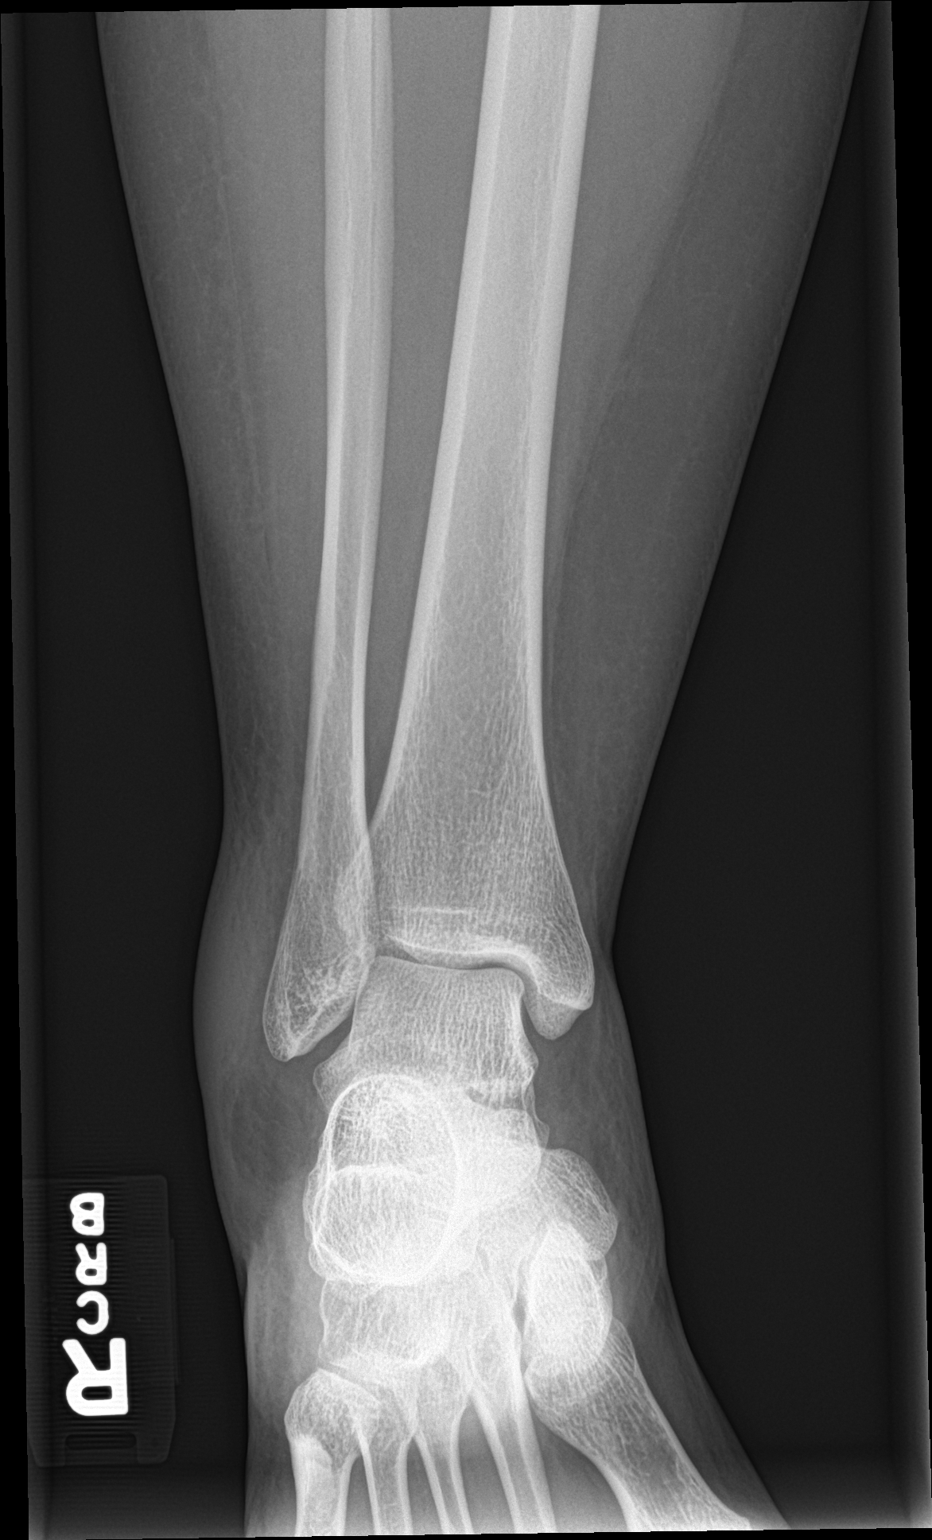

[ankle obl]
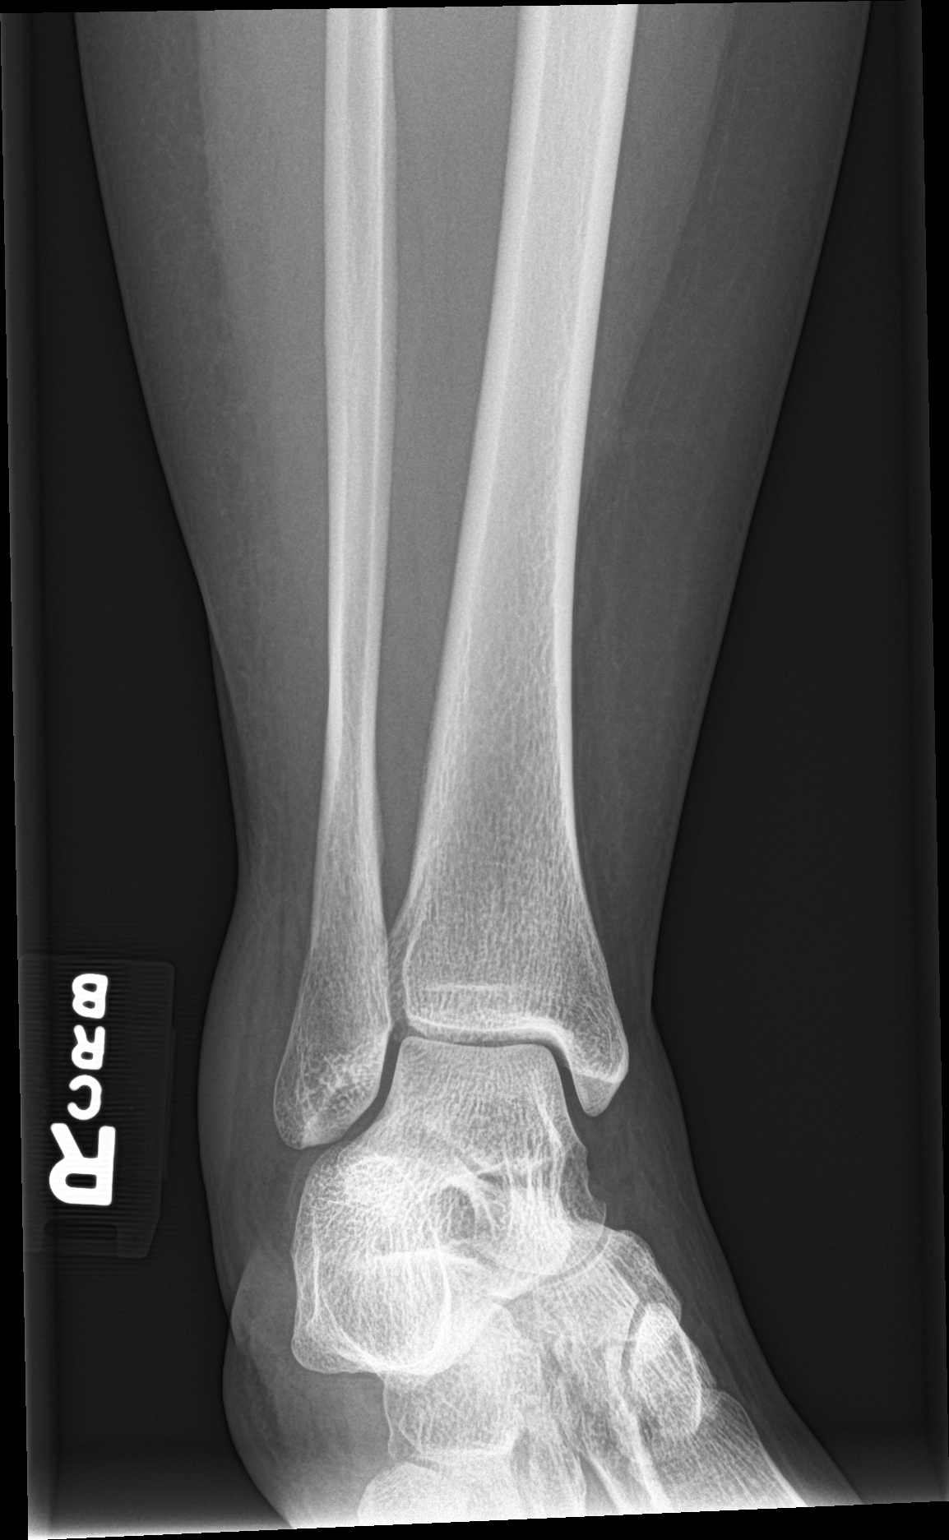

[ankle lat]
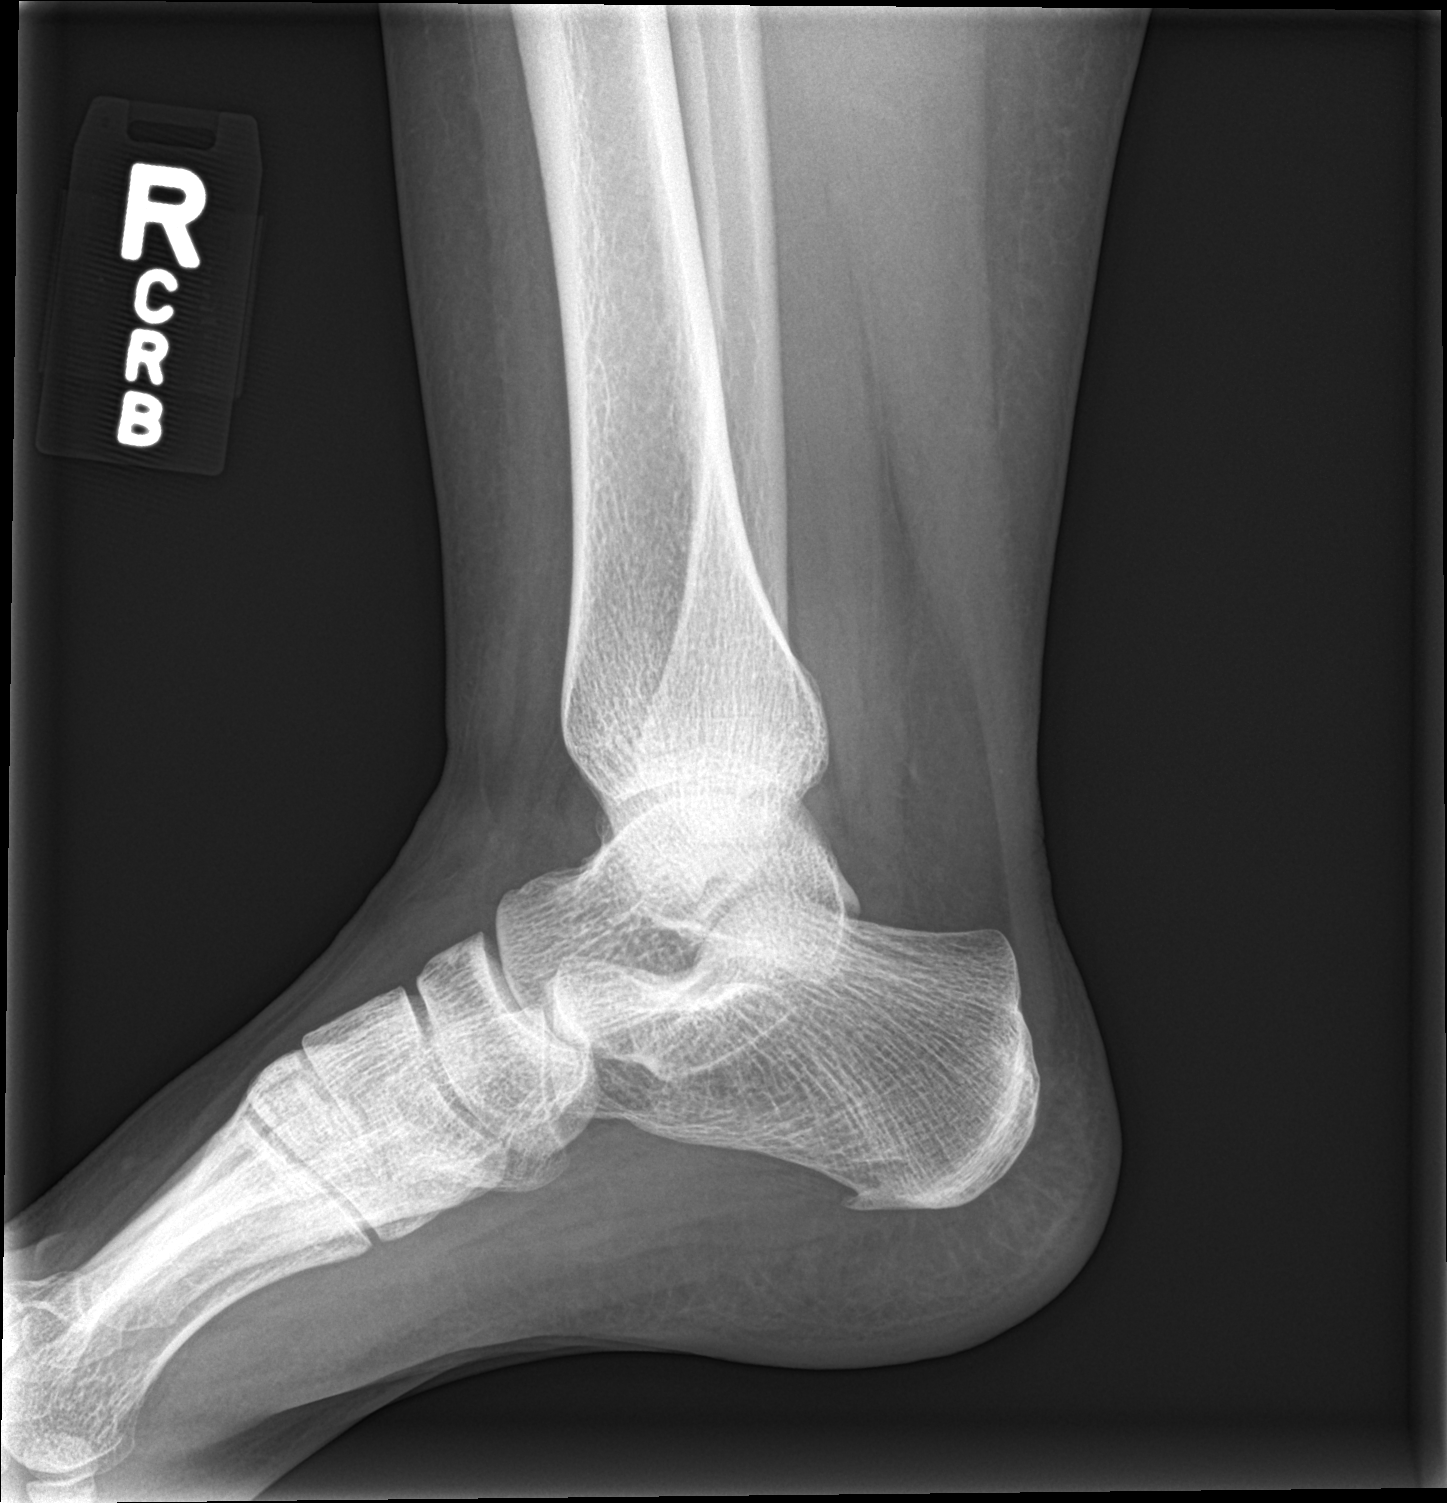

[3 of 3 positions shown; findings below may reference images not displayed]

FINDINGS: There is no evidence of fracture, dislocation, or joint effusion.
Small calcaneal spur. There is no evidence of arthropathy or other
focal bone abnormality. Ankle mortise intact. Lateral soft tissue
swelling.
IMPRESSION: Lateral soft tissue swelling without fracture suggesting ligamentous
injury.

## 2021-02-14 NOTE — Telephone Encounter (Signed)
Nexplanon rcvd/charged 08/13/2019

## 2021-03-29 ENCOUNTER — Other Ambulatory Visit: Payer: Self-pay

## 2021-03-29 ENCOUNTER — Ambulatory Visit
Admission: EM | Admit: 2021-03-29 | Discharge: 2021-03-29 | Disposition: A | Discharge status: Home / Self Care | Payer: BC Managed Care – PPO | Attending: Emergency Medicine | Admitting: Emergency Medicine

## 2021-03-29 DIAGNOSIS — J069 Acute upper respiratory infection, unspecified: Secondary | ICD-10-CM | POA: Diagnosis not present

## 2021-03-29 DIAGNOSIS — J40 Bronchitis, not specified as acute or chronic: Secondary | ICD-10-CM | POA: Diagnosis not present

## 2021-03-29 MED ORDER — ALBUTEROL SULFATE HFA 108 (90 BASE) MCG/ACT IN AERS: 2.0000 | INHALATION_SPRAY | RESPIRATORY_TRACT | 0 refills | Status: DC | PRN

## 2021-03-29 MED ORDER — AEROCHAMBER MV MISC: 2 refills | Status: DC

## 2021-03-29 MED ORDER — IPRATROPIUM BROMIDE 0.06 % NA SOLN: 2.0000 | Freq: Four times a day (QID) | NASAL | 12 refills | Status: DC

## 2021-03-29 MED ORDER — PROMETHAZINE-DM 6.25-15 MG/5ML PO SYRP: 5.0000 mL | ORAL_SOLUTION | Freq: Four times a day (QID) | ORAL | 0 refills | Status: DC | PRN

## 2021-03-29 MED ORDER — DOXYCYCLINE HYCLATE 100 MG PO CAPS: 100.0000 mg | ORAL_CAPSULE | Freq: Two times a day (BID) | ORAL | 0 refills | Status: DC

## 2021-03-29 MED ORDER — BENZONATATE 100 MG PO CAPS: 200.0000 mg | ORAL_CAPSULE | Freq: Three times a day (TID) | ORAL | 0 refills | Status: DC

## 2021-03-29 NOTE — ED Triage Notes (Signed)
Patient presents to Urgent Care with complaints of chest congestion, body aches, sore throat since Sunday. Treating symptoms with alka seltzer.   Denies fever.

## 2021-03-29 NOTE — Discharge Instructions (Signed)
Use the albuterol inhaler/nebulizer every 4-6 hours as needed for shortness of breath, wheezing, and cough.  Take the Doxycyline twice daily with food for 10 days.   Use the Atrovent nasal spray, 2 squirts in each nostril every 6 hours as needed for nasal congestion.  Use the Tessalon Perles every 8 hours for your cough.  Taken with a small sip of water.  They may give you some numbness to the base of your tongue or metallic taste in her mouth, this is normal.  They are designed to calm down the cough reflex.  Use the Promethazine DM cough syrup at bedtime as will make you drowsy.  You may take 1 teaspoon (5 mL) every 6 hours.  Return for reevaluation for new or worsening symptoms.

## 2021-03-29 NOTE — ED Provider Notes (Signed)
[facility]    CSN: [id number] Arrival date & time: 03/29/21  [id number]      History   Chief Complaint Chief Complaint  Patient presents with   Cough   Generalized Body Aches    HPI Brandy Snyder is a 35 y.o. female.   HPI  89 old female here for evaluation of respiratory complaints.  Patient reports that she has been experiencing body aches and a sore throat for the last 3 days.  She describes her chest as feeling tight and causing her to become short of breath on exertion.  She states that as of today she has started to cough up some thick green-brown mucus.  She states she had a mildly elevated temp at 99.9 but no higher.  She does endorse runny nose with a brown-green nasal discharge, left ear pain, and a headache.  She is also complaining of body aches.  She denies any GI symptoms or sick contacts.  She is a smoker but states she has not smoked in the past 2 days.  History reviewed. No pertinent past medical history.  There are no problems to display for this patient.   History reviewed. No pertinent surgical history.  OB History   No obstetric history on file.      Home Medications    Prior to Admission medications   Medication Sig Start Date End Date Taking? Authorizing Provider  albuterol (VENTOLIN HFA) 108 (90 Base) MCG/ACT inhaler Inhale 2 puffs into the lungs every 4 (four) hours as needed. 03/29/21  Yes Margarette Canada, NP  benzonatate (TESSALON) 100 MG capsule Take 2 capsules (200 mg total) by mouth every 8 (eight) hours. 03/29/21  Yes Margarette Canada, NP  doxycycline (VIBRAMYCIN) 100 MG capsule Take 1 capsule (100 mg total) by mouth 2 (two) times daily. 03/29/21  Yes Margarette Canada, NP  ipratropium (ATROVENT) 0.06 % nasal spray Place 2 sprays into both nostrils 4 (four) times daily. 03/29/21  Yes Margarette Canada, NP  promethazine-dextromethorphan (PROMETHAZINE-DM) 6.25-15 MG/5ML syrup Take 5 mLs by mouth 4 (four) times daily as needed. 03/29/21  Yes Margarette Canada, NP  Spacer/Aero-Holding Chambers (AEROCHAMBER MV) inhaler Use as instructed 03/29/21  Yes Margarette Canada, NP  etonogestrel (NEXPLANON) 68 MG IMPL implant 1 each by Subdermal route once.    [provider]    Family History History reviewed. No pertinent family history.  Social History Social History   Tobacco Use   Smoking status: Every Day    Types: Cigarettes   Smokeless tobacco: Never  Vaping Use   Vaping Use: Never used  Substance Use Topics   Alcohol use: Yes    Comment: social drinker   Drug use: No     Allergies   Bactrim [sulfamethoxazole-trimethoprim]   Review of Systems Review of Systems  Constitutional:  Positive for fever. Negative for activity change and appetite change.  HENT:  Positive for congestion, ear pain, rhinorrhea and sore throat.   Respiratory:  Positive for cough and shortness of breath. Negative for wheezing.   Gastrointestinal:  Negative for diarrhea, nausea and vomiting.  Musculoskeletal:  Positive for arthralgias and myalgias.  Skin:  Negative for rash.  Neurological:  Positive for headaches.  Hematological: Negative.   Psychiatric/Behavioral: Negative.      Physical Exam Triage Vital Signs ED Triage Vitals  Enc Vitals Group     BP 03/29/21 1111 (!) 127/99     Pulse Rate 03/29/21 1111 84     Resp 03/29/21 1111 16  Temp 03/29/21 1111 98.7 F (37.1 C)     Temp Source 03/29/21 1111 Oral     SpO2 03/29/21 1111 100 %     Weight --      Height --      Head Circumference --      Peak Flow --      Pain Score 03/29/21 1109 0     Pain Loc --      Pain Edu? --      Excl. in [place]? --    No data found.  Updated Vital Signs BP (!) 127/99 (BP Location: Left Arm)   Pulse 84   Temp 98.7 F (37.1 C) (Oral)   Resp 16   SpO2 100%   Visual Acuity Right Eye Distance:   Left Eye Distance:   Bilateral Distance:    Right Eye Near:   Left Eye Near:    Bilateral Near:     Physical Exam Vitals and nursing note reviewed.   Constitutional:      General: She is not in acute distress.    Appearance: Normal appearance. She is normal weight. She is not ill-appearing.  HENT:     Head: Normocephalic and atraumatic.     Right Ear: Tympanic membrane, ear canal and external ear normal. There is no impacted cerumen.     Left Ear: Tympanic membrane, ear canal and external ear normal. There is no impacted cerumen.     Nose: Congestion and rhinorrhea present.     Mouth/Throat:     Mouth: Mucous membranes are moist.     Pharynx: Oropharynx is clear. Posterior oropharyngeal erythema present.  Cardiovascular:     Rate and Rhythm: Normal rate and regular rhythm.     Pulses: Normal pulses.     Heart sounds: Normal heart sounds. No murmur heard.   No gallop.  Pulmonary:     Effort: Pulmonary effort is normal.     Breath sounds: Wheezing and rhonchi present. No rales.  Musculoskeletal:     Cervical back: Normal range of motion and neck supple.  Lymphadenopathy:     Cervical: No cervical adenopathy.  Skin:    General: Skin is warm and dry.     Capillary Refill: Capillary refill takes less than 2 seconds.     Findings: No erythema or rash.  Neurological:     General: No focal deficit present.     Mental Status: She is alert and oriented to person, place, and time.  Psychiatric:        Mood and Affect: Mood normal.        Behavior: Behavior normal.        Thought Content: Thought content normal.        Judgment: Judgment normal.     UC Treatments / Results  Labs (all labs ordered are listed, but only abnormal results are displayed) Labs Reviewed - No data to display  EKG   Radiology No results found.  Procedures Procedures (including critical care time)  Medications Ordered in UC Medications - No data to display  Initial Impression / Assessment and Plan / UC Course  I have reviewed the triage vital signs and the nursing notes.  Pertinent labs & imaging results that were available during my care of  the patient were reviewed by me and considered in my medical decision making (see chart for details).  Patient is a pleasant, nontoxic-appearing 34 old female here for evaluation of respiratory complaints as outlined HPI above.  Patient's physical  exam reveals pearly gray tympanic membranes bilaterally with normal light reflex and clear external auditory canals.  Nasal mucosa is erythematous and edematous with clear nasal discharge in both nares.  Oropharyngeal exam reveals posterior oropharyngeal erythema with clear postnasal drip.  No cervical lymphadenopathy appreciable exam.  Cardiopulmonary exam reveals inspiratory and expiratory wheezes in all lung fields.  Patient does have mild rhonchi in bilateral middle lung fields.  Patient exam is consistent with upper respiratory infection and bronchitis, most likely viral but given her history of being a smoker will cover with doxycycline twice daily with food for 10 days.  I have also prescribed an albuterol inhaler and spacer for the patient to use for shortness of breath or wheezing.  She can do 2 puffs every 4-6 hours.  I have also prescribed Tessalon Perles, Atrovent nasal spray, Promethazine DM to help with cough and nasal congestion.  Work note provided.   Final Clinical Impressions(s) / UC Diagnoses   Final diagnoses:  Upper respiratory tract infection, unspecified type  Bronchitis     Discharge Instructions      Use the albuterol inhaler/nebulizer every 4-6 hours as needed for shortness of breath, wheezing, and cough.  Take the Doxycyline twice daily with food for 10 days.   Use the Atrovent nasal spray, 2 squirts in each nostril every 6 hours as needed for nasal congestion.  Use the Tessalon Perles every 8 hours for your cough.  Taken with a small sip of water.  They may give you some numbness to the base of your tongue or metallic taste in her mouth, this is normal.  They are designed to calm down the cough reflex.  Use the  Promethazine DM cough syrup at bedtime as will make you drowsy.  You may take 1 teaspoon (5 mL) every 6 hours.  Return for reevaluation for new or worsening symptoms.      ED Prescriptions     Medication Sig Dispense Auth. Provider   benzonatate (TESSALON) 100 MG capsule Take 2 capsules (200 mg total) by mouth every 8 (eight) hours. 21 capsule Becky Augusta, NP   ipratropium (ATROVENT) 0.06 % nasal spray Place 2 sprays into both nostrils 4 (four) times daily. 15 mL Becky Augusta, NP   doxycycline (VIBRAMYCIN) 100 MG capsule Take 1 capsule (100 mg total) by mouth 2 (two) times daily. 20 capsule Becky Augusta, NP   promethazine-dextromethorphan (PROMETHAZINE-DM) 6.25-15 MG/5ML syrup Take 5 mLs by mouth 4 (four) times daily as needed. 118 mL Becky Augusta, NP   albuterol (VENTOLIN HFA) 108 (90 Base) MCG/ACT inhaler Inhale 2 puffs into the lungs every 4 (four) hours as needed. 18 g Becky Augusta, NP   Spacer/Aero-Holding Chambers (AEROCHAMBER MV) inhaler Use as instructed 1 each Becky Augusta, NP      PDMP not reviewed this encounter.   Becky Augusta, NP 03/29/21 1159

## 2022-09-13 ENCOUNTER — Ambulatory Visit
Admission: EM | Admit: 2022-09-13 | Discharge: 2022-09-13 | Disposition: A | Discharge status: Home / Self Care | Payer: 59 | Attending: Family Medicine | Admitting: Family Medicine

## 2022-09-13 DIAGNOSIS — F17210 Nicotine dependence, cigarettes, uncomplicated: Secondary | ICD-10-CM | POA: Insufficient documentation

## 2022-09-13 DIAGNOSIS — Z1152 Encounter for screening for COVID-19: Secondary | ICD-10-CM | POA: Insufficient documentation

## 2022-09-13 DIAGNOSIS — B349 Viral infection, unspecified: Secondary | ICD-10-CM | POA: Diagnosis not present

## 2022-09-13 DIAGNOSIS — R519 Headache, unspecified: Secondary | ICD-10-CM | POA: Diagnosis present

## 2022-09-13 LAB — SARS CORONAVIRUS 2 BY RT PCR: SARS Coronavirus 2 by RT PCR: NEGATIVE

## 2022-09-13 MED ORDER — PROMETHAZINE-DM 6.25-15 MG/5ML PO SYRP: 5.0000 mL | ORAL_SOLUTION | Freq: Four times a day (QID) | ORAL | 0 refills | Status: DC | PRN

## 2022-09-13 MED ORDER — BENZONATATE 100 MG PO CAPS: 200.0000 mg | ORAL_CAPSULE | Freq: Three times a day (TID) | ORAL | 0 refills | Status: DC

## 2022-09-13 NOTE — Discharge Instructions (Signed)
We will contact you if your COVID test is positive.  Please quarantine while you wait for the results.  If your test is negative you may resume normal activities.  If your test is positive, quarantine until you are without a fever for at least 24 hours without fever-lowering (Tylenol/Motrin) medications.    If your were prescribed medication, stop by the pharmacy to pick them up.   You can take Tylenol and/or Ibuprofen as needed for fever reduction and pain relief.    For cough: honey 1/2 to 1 teaspoon (you can dilute the honey in water or another fluid).  You can also use guaifenesin and dextromethorphan for cough. You can use a humidifier for chest congestion and cough.  If you don't have a humidifier, you can sit in the bathroom with the hot shower running.      For sore throat: try warm salt water gargles, Mucinex sore throat cough drops or cepacol lozenges, throat spray, warm tea or water with lemon/honey, popsicles or ice, or OTC cold relief medicine for throat discomfort. You can also purchase chloraseptic spray at the pharmacy or dollar store.   For congestion: take a daily anti-histamine like Zyrtec, Claritin, and a oral decongestant, such as pseudoephedrine.  You can also use Flonase 1-2 sprays in each nostril daily. Afrin is also a good option, if you do not have high blood pressure.    It is important to stay hydrated: drink plenty of fluids (water, gatorade/powerade/pedialyte, juices, or teas) to keep your throat moisturized and help further relieve irritation/discomfort.    Return or go to the Emergency Department if symptoms worsen or do not improve in the next few days  

## 2022-09-13 NOTE — ED Provider Notes (Signed)
[facility]    CSN: [id number] Arrival date & time: 09/13/22  1540      History   Chief Complaint Chief Complaint  Patient presents with   Headache   Nasal Congestion   Generalized Body Aches    HPI Brandy Snyder is a 37 y.o. female.   HPI  History History obtained from the patient. Brandy Snyder presents for chest tightness with productive cough that started yesterday. Had a fever and woke up this morning. Has headache and nasal congestion with body aches. This girl at her job came in sick the other day.   She had vomiting yesterday but none since yesterday afternoon. No diarrhea or belly pain. Took DayQuil cold and flu and Goody Powder for her headache.   She smokes cigarettes (0.5 ppd for 12 years).     History reviewed. No pertinent past medical history.  There are no problems to display for this patient.   History reviewed. No pertinent surgical history.  OB History   No obstetric history on file.      Home Medications    Prior to Admission medications   Medication Sig Start Date End Date Taking? Authorizing Provider  etonogestrel (NEXPLANON) 68 MG IMPL implant 1 each by Subdermal route once.   Yes [provider]  albuterol (VENTOLIN HFA) 108 (90 Base) MCG/ACT inhaler Inhale 2 puffs into the lungs every 4 (four) hours as needed. 03/29/21   Becky Augusta, NP  benzonatate (TESSALON) 100 MG capsule Take 2 capsules (200 mg total) by mouth every 8 (eight) hours. 09/13/22   Lenetta Piche, Seward Meth, DO  ipratropium (ATROVENT) 0.06 % nasal spray Place 2 sprays into both nostrils 4 (four) times daily. 03/29/21   Becky Augusta, NP  promethazine-dextromethorphan (PROMETHAZINE-DM) 6.25-15 MG/5ML syrup Take 5 mLs by mouth 4 (four) times daily as needed. 09/13/22   Katha Cabal, DO  Spacer/Aero-Holding Chambers (AEROCHAMBER MV) inhaler Use as instructed 03/29/21   Becky Augusta, NP    Family History No family history on file.  Social History Social History    Tobacco Use   Smoking status: Every Day    Types: Cigarettes   Smokeless tobacco: Never  Vaping Use   Vaping Use: Never used  Substance Use Topics   Alcohol use: Yes    Comment: social drinker   Drug use: No     Allergies   Bactrim [sulfamethoxazole-trimethoprim]   Review of Systems Review of Systems: negative unless otherwise stated in HPI.      Physical Exam Triage Vital Signs ED Triage Vitals  Enc Vitals Group     BP 09/13/22 1639 (!) 137/94     Pulse Rate 09/13/22 1639 73     Resp --      Temp 09/13/22 1639 98.3 F (36.8 C)     Temp Source 09/13/22 1639 Oral     SpO2 09/13/22 1639 96 %     Weight --      Height --      Head Circumference --      Peak Flow --      Pain Score 09/13/22 1638 7     Pain Loc --      Pain Edu? --      Excl. in GC? --    No data found.  Updated Vital Signs BP (!) 137/94 (BP Location: Right Arm)   Pulse 73   Temp 98.3 F (36.8 C) (Oral)   SpO2 96%   Visual Acuity Right Eye Distance:   Left  Eye Distance:   Bilateral Distance:    Right Eye Near:   Left Eye Near:    Bilateral Near:     Physical Exam GEN:     alert, non-toxic appearing female in no distress    HENT:  mucus membranes moist, oropharyngeal without lesions, mild erythema, 1+ tonsillar hypertrophy without exudates, clear nasal discharge EYES:   pupils equal and reactive, no scleral injection or discharge NECK:  normal ROM, no meningismus   RESP:  no increased work of breathing, clear to auscultation bilaterally except frequent coughing CVS:   regular rate and rhythm Skin:   warm and dry, no rash on visible skin    UC Treatments / Results  Labs (all labs ordered are listed, but only abnormal results are displayed) Labs Reviewed  SARS CORONAVIRUS 2 BY RT PCR    EKG   Radiology No results found.  Procedures Procedures (including critical care time)  Medications Ordered in UC Medications - No data to display  Initial Impression / Assessment  and Plan / UC Course  I have reviewed the triage vital signs and the nursing notes.  Pertinent labs & imaging results that were available during my care of the patient were reviewed by me and considered in my medical decision making (see chart for details).       Pt is a 37 y.o. female who presents for 1 day of respiratory symptoms. Matthew is afebrile here. Satting well on room air. Overall pt is ill but non-toxic appearing, well hydrated, without respiratory distress. Pulmonary exam is unremarkable except for cough.  COVID testing obtained. Pt to quarantine until COVID test results or longer if positive.  I will call patient with test results, if positive. History consistent with viral respiratory illness. Discussed symptomatic treatment.  Explained lack of efficacy of antibiotics in viral disease.  Typical duration of symptoms discussed. Promethazine DM and tessalon perles for cough.   COVID test is negative.   Return and ED precautions given and voiced understanding. Discussed MDM, treatment plan and plan for follow-up with patient who agrees with plan.     Final Clinical Impressions(s) / UC Diagnoses   Final diagnoses:  Viral illness     Discharge Instructions      We will contact you if your COVID test is positive.  Please quarantine while you wait for the results.  If your test is negative you may resume normal activities.  If your test is positive, quarantine until you are without a fever for at least 24 hours without fever-lowering (Tylenol/Motrin) medications.    If your were prescribed medication, stop by the pharmacy to pick them up.   You can take Tylenol and/or Ibuprofen as needed for fever reduction and pain relief.    For cough: honey 1/2 to 1 teaspoon (you can dilute the honey in water or another fluid).  You can also use guaifenesin and dextromethorphan for cough. You can use a humidifier for chest congestion and cough.  If you don't have a humidifier, you can sit in  the bathroom with the hot shower running.      For sore throat: try warm salt water gargles, Mucinex sore throat cough drops or cepacol lozenges, throat spray, warm tea or water with lemon/honey, popsicles or ice, or OTC cold relief medicine for throat discomfort. You can also purchase chloraseptic spray at the pharmacy or dollar store.   For congestion: take a daily anti-histamine like Zyrtec, Claritin, and a oral decongestant, such as pseudoephedrine.  You can also use Flonase 1-2 sprays in each nostril daily. Afrin is also a good option, if you do not have high blood pressure.    It is important to stay hydrated: drink plenty of fluids (water, gatorade/powerade/pedialyte, juices, or teas) to keep your throat moisturized and help further relieve irritation/discomfort.    Return or go to the Emergency Department if symptoms worsen or do not improve in the next few days      ED Prescriptions     Medication Sig Dispense Auth. Provider   benzonatate (TESSALON) 100 MG capsule Take 2 capsules (200 mg total) by mouth every 8 (eight) hours. 21 capsule Kwamane Whack, DO   promethazine-dextromethorphan (PROMETHAZINE-DM) 6.25-15 MG/5ML syrup Take 5 mLs by mouth 4 (four) times daily as needed. 118 mL Katha Cabal, DO      PDMP not reviewed this encounter.   Katha Cabal, DO 09/13/22 1800

## 2022-09-13 NOTE — ED Triage Notes (Addendum)
Pt c/o cough, congestion, sweats, fever yesterday. Pt states she has been taking dayquil for sxs. Pt does all reports lots of chest congestion, productive cough with brown stuff.

## 2023-05-06 ENCOUNTER — Ambulatory Visit
Admission: EM | Admit: 2023-05-06 | Discharge: 2023-05-06 | Disposition: A | Discharge status: Home / Self Care | Payer: BC Managed Care – PPO | Attending: Emergency Medicine | Admitting: Emergency Medicine

## 2023-05-06 ENCOUNTER — Encounter: Payer: Self-pay | Admitting: Emergency Medicine

## 2023-05-06 DIAGNOSIS — U071 COVID-19: Secondary | ICD-10-CM | POA: Insufficient documentation

## 2023-05-06 LAB — RESP PANEL BY RT-PCR (FLU A&B, COVID) ARPGX2
Influenza A by PCR: NEGATIVE
Influenza B by PCR: NEGATIVE
SARS Coronavirus 2 by RT PCR: POSITIVE — AB

## 2023-05-06 MED ORDER — PROMETHAZINE-DM 6.25-15 MG/5ML PO SYRP: 5.0000 mL | ORAL_SOLUTION | Freq: Four times a day (QID) | ORAL | 0 refills | Status: AC | PRN

## 2023-05-06 MED ORDER — NAPROXEN 500 MG PO TABS: 500.0000 mg | ORAL_TABLET | Freq: Two times a day (BID) | ORAL | 0 refills | Status: AC

## 2023-05-06 MED ORDER — FLUTICASONE PROPIONATE 50 MCG/ACT NA SUSP: 2.0000 | Freq: Every day | NASAL | 0 refills | Status: AC

## 2023-05-06 NOTE — Discharge Instructions (Signed)
 Return here if you start to get worse and we can get a chest x-ray to evaluate for pneumonia.  COVID test is positive.  Promethazine  DM for cough.  Flonase .  Naprosyn , and 1000 mg of Tylenol twice a day.  Mucinex D, saline nasal irrigation with a Aureliano Med rinse with distilled water as often as you want to prevent a secondary bacterial sinus infection.  You must mask for 8 more days.  You can return to work once you have been afebrile for 24 hours without the use of fever reducing medications such as Tylenol, Naprosyn , ibuprofen and you are feeling better.  Here is a list of primary care providers who are taking new patients:  [facility] [place] Dr. Selinda Ku (sports medicine) Dr. Cathryne Molt [address] [place] [address] [phone]  [facility] [address] [place], [place] [address] [phone]  [facility] [place] [address]  [place] [address]  [phone]  [facility] [address] [place], [place] [address] [phone]  [facility] [address] [place]  [phone] [place], [place] [address]  Here are clinics/ other resources who will see you if you do not have insurance. Some have certain criteria that you must meet. Call them and find out what they are:  [facility]: [address], [place], [place] [address] Phone: [phone] Hours: First and Third Saturdays of each Month, 9 a.m. - 1 p.m.  [facility]: [address], [address] [place], [place] [address] Phone: [phone] Hours: Tuesday, 4 p.m. - 8 p.m. Thursday, 1 p.m. - 8 p.m. Wednesday, 9 a.m. - [facility] [address], [place], [place] [address] Phone: [phone] Pharmacy Phone Number: [phone] Dental Phone Number: [phone] [facility] Insurance Help: [phone]  Dental Hours: Monday - Thursday, 8 a.m. - 6 p.m.  [facility] [address], [place],  [place] [address] Phone: [phone] Pharmacy Phone Number: [phone] [facility] Insurance Help: [phone]  [facility] [address] [place], [place] [address] Phone: [phone] Pharmacy Phone Number: [phone] [facility] Insurance Help: [phone]  [facility] [address] [place], [place] [address] Phone: [phone] [facility] Insurance Help: [phone]   [facility] [address] [place], [place] [address] Phone: [phone]  Go to www.goodrx.com  or www.costplusdrugs.com to look up your medications. This will give you a list of where you can find your prescriptions at the most affordable prices. Or ask the pharmacist what the cash price is, or if they have any other discount programs available to help make your medication more affordable. This can be less expensive than what you would pay with insurance.

## 2023-05-06 NOTE — ED Provider Notes (Signed)
 HPI  SUBJECTIVE:  Brandy Snyder is a 38 y.o. female who presents with 3 days of bodyaches, headaches, nasal congestion, cough productive of brown sputum, bilateral ear pain starting yesterday.  She states that she has felt feverish, but does not have a thermometer at home.  She reports chills, greenish-brown rhinorrhea, postnasal drip, chest soreness from coughing, scratchy throat.  She is unable to sleep at night because of the cough.  No sinus pain or pressure, facial swelling, upper dental pain, wheezing or shortness of breath, dyspnea on exertion, nausea, vomiting, diarrhea, abdominal pain.  No known COVID or flu exposure.  She got 2 doses of the COVID-vaccine.  She did not get this years flu vaccine.  No antibiotics in the past 3 months.  She took a Goody's powder within the past 6 hours with improvement in her headache.  She has also been taking Mucinex with improvement.  No aggravating factors.  Past medical history: None.  LMP: Amenorrheic due to Nexplanon .  Denies the possibility of being pregnant.  PCP: None.    History reviewed. No pertinent past medical history.  History reviewed. No pertinent surgical history.  History reviewed. No pertinent family history.  Social History   Tobacco Use   Smoking status: Every Day    Types: Cigarettes   Smokeless tobacco: Never  Vaping Use   Vaping status: Never Used  Substance Use Topics   Alcohol use: Yes    Comment: social drinker   Drug use: No    No current facility-administered medications for this encounter.  Current Outpatient Medications:    etonogestrel  (NEXPLANON ) 68 MG IMPL implant, 1 each by Subdermal route once., Disp: , Rfl:    fluticasone  (FLONASE ) 50 MCG/ACT nasal spray, Place 2 sprays into both nostrils daily., Disp: 16 g, Rfl: 0   naproxen  (NAPROSYN ) 500 MG tablet, Take 1 tablet (500 mg total) by mouth 2 (two) times daily., Disp: 20 tablet, Rfl: 0   promethazine -dextromethorphan (PROMETHAZINE -DM) 6.25-15 MG/5ML  syrup, Take 5 mLs by mouth 4 (four) times daily as needed for cough., Disp: 118 mL, Rfl: 0  Allergies  Allergen Reactions   Bactrim [Sulfamethoxazole-Trimethoprim] Rash     ROS  As noted in HPI.   Physical Exam  Pulse 77   Temp 98.6 F (37 C) (Oral)   Resp 16   SpO2 99%   Constitutional: Well developed, well nourished, no acute distress Eyes:  EOMI, conjunctiva normal bilaterally HENT: Normocephalic, atraumatic,mucus membranes moist.  Clear rhinorrhea.  Normal turbinates.  No sinus tenderness.  No postnasal drip.  Normal oropharynx. Neck: No cervical lymphadenopathy Respiratory: Normal inspiratory effort, lungs clear bilaterally.  No anterior, lateral chest wall tenderness Cardiovascular: Normal rate, regular rhythm, no murmurs rubs or gallops GI: nondistended skin: No rash, skin intact Musculoskeletal: no deformities Neurologic: Alert & oriented x 3, no focal neuro deficits Psychiatric: Speech and behavior appropriate   ED Course   Medications - No data to display  Orders Placed This Encounter  Procedures   Resp Panel by RT-PCR (Flu A&B, Covid) Anterior Nasal Swab    Standing Status:   Standing    Number of Occurrences:   1    Results for orders placed or performed during the hospital encounter of 05/06/23 (from the past 24 hours)  Resp Panel by RT-PCR (Flu A&B, Covid) Anterior Nasal Swab     Status: Abnormal   Collection Time: 05/06/23  9:13 AM   Specimen: Anterior Nasal Swab  Result Value Ref Range   SARS  Coronavirus 2 by RT PCR POSITIVE (A) NEGATIVE   Influenza A by PCR NEGATIVE NEGATIVE   Influenza B by PCR NEGATIVE NEGATIVE   No results found.  ED Clinical Impression  1. COVID-19 virus infection      ED Assessment/Plan    COVID-positive.  Discussed result with patient while in department.  Do not think that antivirals would be of much benefit as she is otherwise healthy and fully immunized..  Will send home with Promethazine  DM, Flonase ,  Naprosyn /Tylenol, Mucinex D saline nasal irrigation, must mask for 8 days, and work note for several days.  Will provide primary care list for routine care.  Discussed labs, MDM, treatment plan, and plan for follow-up with patient. patient agrees with plan.   Meds ordered this encounter  Medications   promethazine -dextromethorphan (PROMETHAZINE -DM) 6.25-15 MG/5ML syrup    Sig: Take 5 mLs by mouth 4 (four) times daily as needed for cough.    Dispense:  118 mL    Refill:  0   fluticasone  (FLONASE ) 50 MCG/ACT nasal spray    Sig: Place 2 sprays into both nostrils daily.    Dispense:  16 g    Refill:  0   naproxen  (NAPROSYN ) 500 MG tablet    Sig: Take 1 tablet (500 mg total) by mouth 2 (two) times daily.    Dispense:  20 tablet    Refill:  0      *This clinic note was created using Scientist, clinical (histocompatibility and immunogenetics). Therefore, there may be occasional mistakes despite careful proofreading.  ?    Van Knee, MD 05/09/23 1511

## 2023-05-06 NOTE — ED Triage Notes (Signed)
 Pt presents with a cough, congestion, bodyaches and headaches x 3 days.
